# Patient Record
Sex: Female | Born: 1945 | Race: White | Hispanic: No | Marital: Married | Smoking: Former smoker
Health system: Southern US, Community
[De-identification: ages and names within clinical notes are randomized; demographics above are authoritative.]

## PROBLEM LIST (undated history)

## (undated) DIAGNOSIS — E039 Hypothyroidism, unspecified: Secondary | ICD-10-CM

## (undated) DIAGNOSIS — I10 Essential (primary) hypertension: Secondary | ICD-10-CM

## (undated) DIAGNOSIS — J302 Other seasonal allergic rhinitis: Secondary | ICD-10-CM

## (undated) HISTORY — PX: APPENDECTOMY: SHX54

## (undated) HISTORY — PX: OTHER SURGICAL HISTORY: SHX169

---

## 2019-06-29 ENCOUNTER — Other Ambulatory Visit: Payer: Self-pay | Admitting: Family Medicine

## 2019-06-29 DIAGNOSIS — Z1231 Encounter for screening mammogram for malignant neoplasm of breast: Secondary | ICD-10-CM

## 2019-08-12 ENCOUNTER — Ambulatory Visit
Admission: RE | Admit: 2019-08-12 | Discharge: 2019-08-12 | Disposition: A | Discharge status: Home / Self Care | Payer: Medicare Other | Source: Ambulatory Visit | Attending: Family Medicine | Admitting: Family Medicine

## 2019-08-12 ENCOUNTER — Other Ambulatory Visit: Payer: Self-pay

## 2019-08-12 DIAGNOSIS — Z1231 Encounter for screening mammogram for malignant neoplasm of breast: Secondary | ICD-10-CM

## 2020-09-14 ENCOUNTER — Other Ambulatory Visit: Payer: Self-pay | Admitting: Physician Assistant

## 2020-09-14 DIAGNOSIS — Z1231 Encounter for screening mammogram for malignant neoplasm of breast: Secondary | ICD-10-CM

## 2020-10-17 ENCOUNTER — Ambulatory Visit: Payer: Medicare Other

## 2020-11-30 ENCOUNTER — Ambulatory Visit: Payer: Medicare Other

## 2020-12-26 ENCOUNTER — Ambulatory Visit
Admission: RE | Admit: 2020-12-26 | Discharge: 2020-12-26 | Disposition: A | Discharge status: Home / Self Care | Payer: Medicare Other | Source: Ambulatory Visit | Attending: Physician Assistant | Admitting: Physician Assistant

## 2020-12-26 ENCOUNTER — Other Ambulatory Visit: Payer: Self-pay

## 2020-12-26 DIAGNOSIS — Z1231 Encounter for screening mammogram for malignant neoplasm of breast: Secondary | ICD-10-CM

## 2021-04-06 IMAGING — MG MM DIGITAL SCREENING BILAT W/ TOMO AND CAD
8 of 14 series · 8 of 40 positions shown · non-contrast
Comparison: Previous exam(s).

CLINICAL DATA: Screening.

EXAM:
DIGITAL SCREENING BILATERAL MAMMOGRAM WITH TOMO AND CAD

[L MLO synth-2D (1 of 2)]
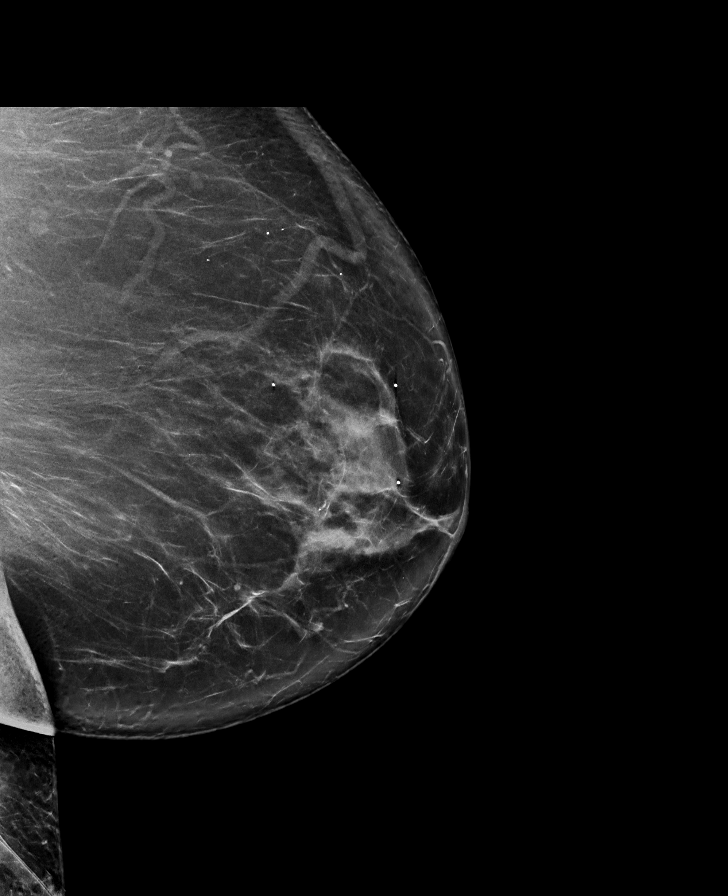

[L CC synth-2D]
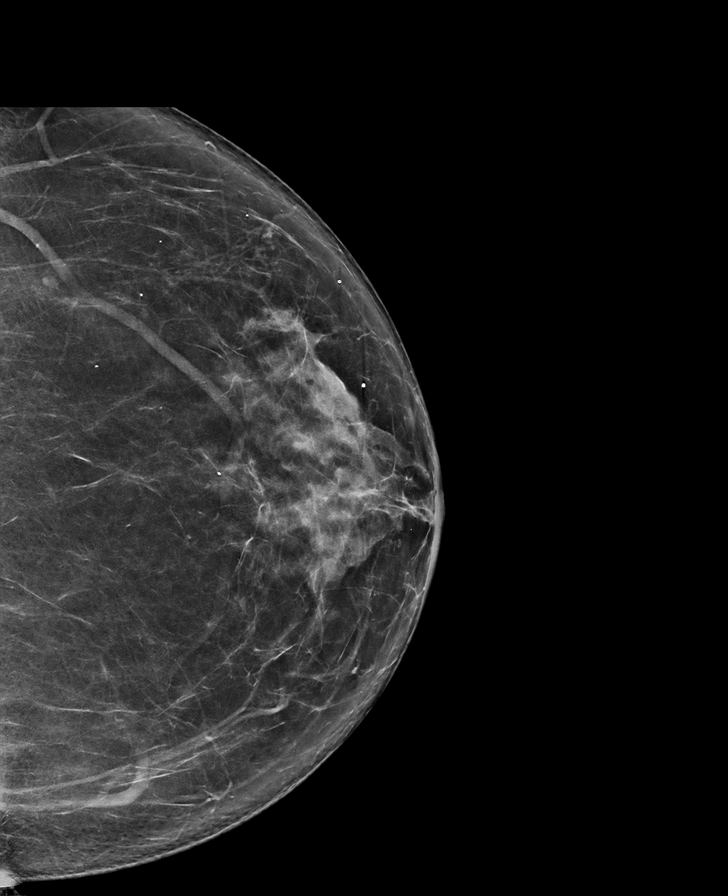

[R CC synth-2D (1 of 2)]
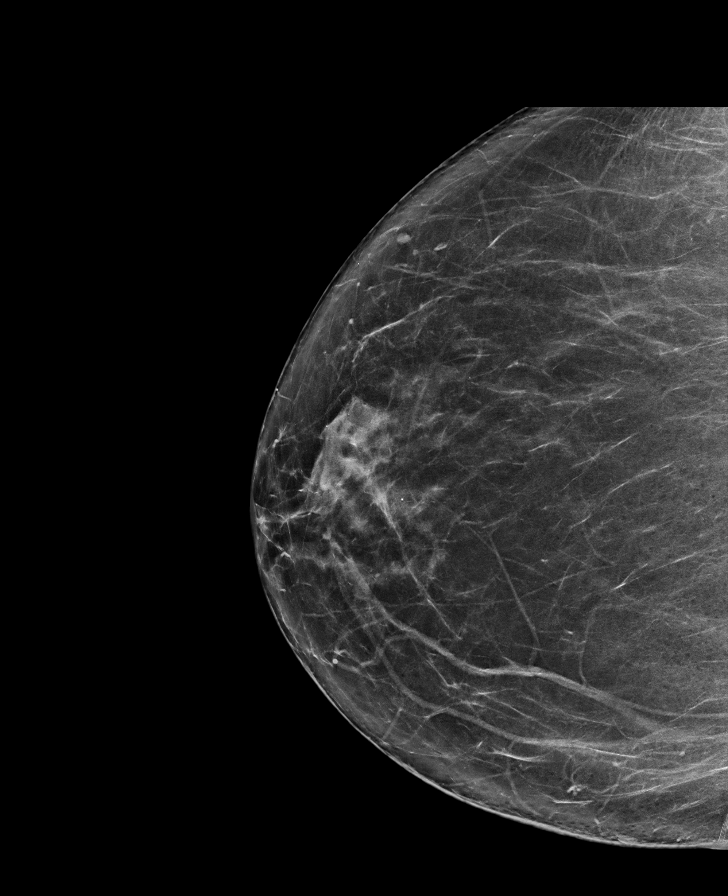

[R CC synth-2D (2 of 2)]
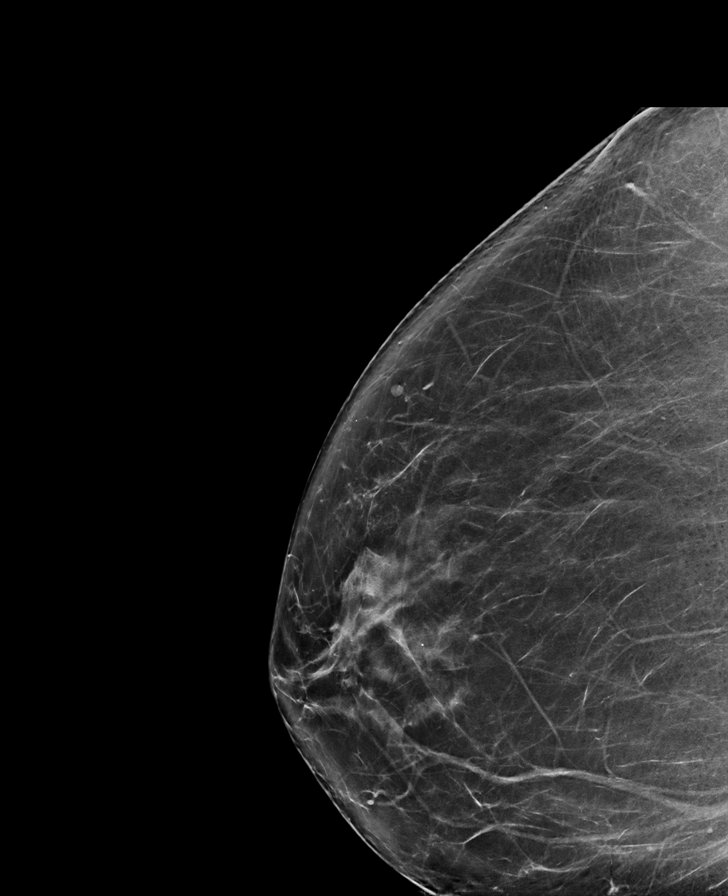

[R MLO synth-2D (1 of 2)]
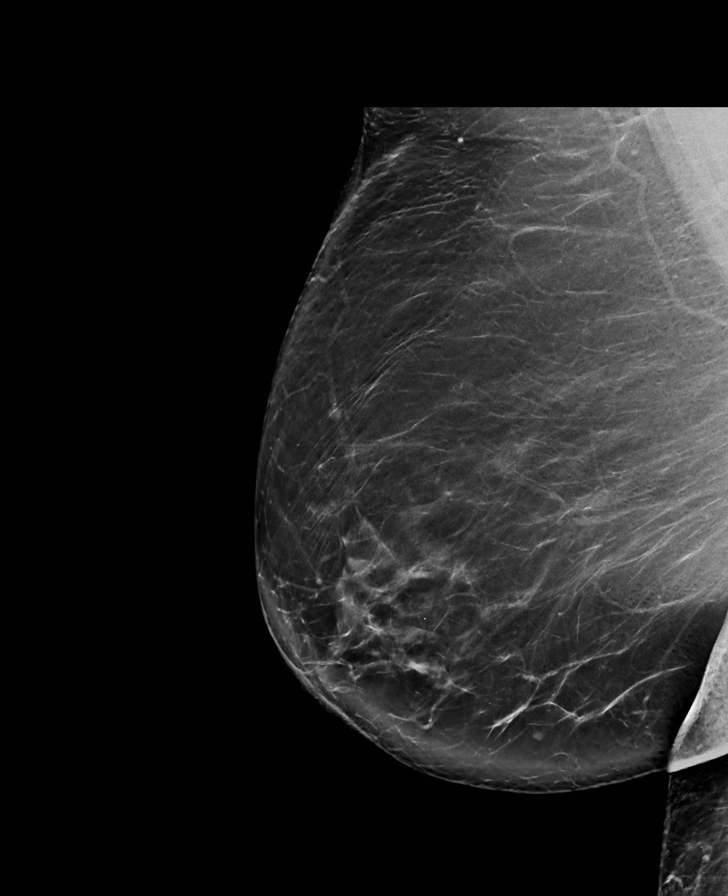

[R MLO synth-2D (2 of 2)]
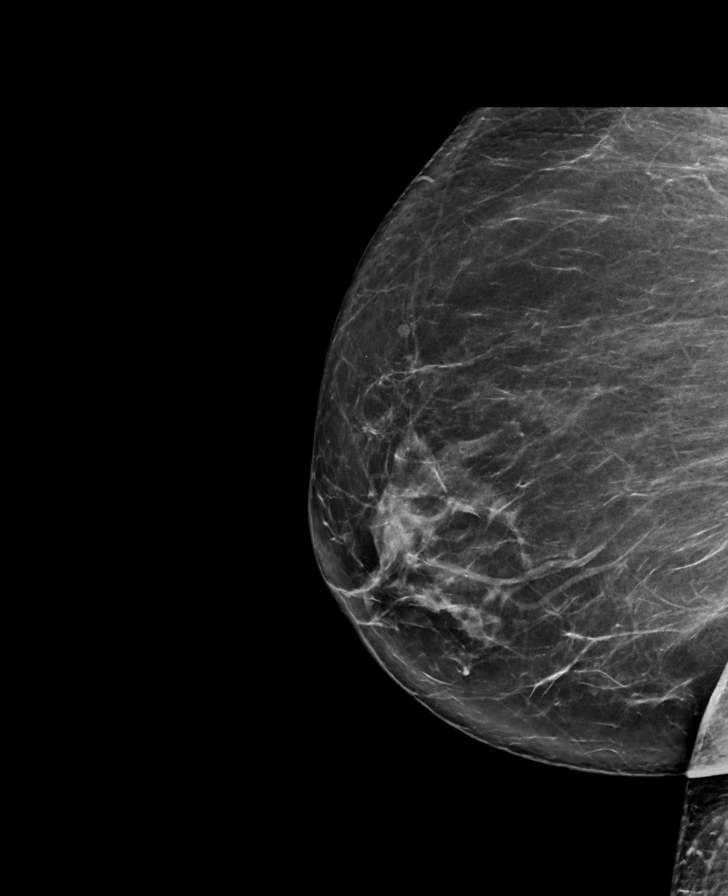

[L MLO synth-2D (2 of 2)]
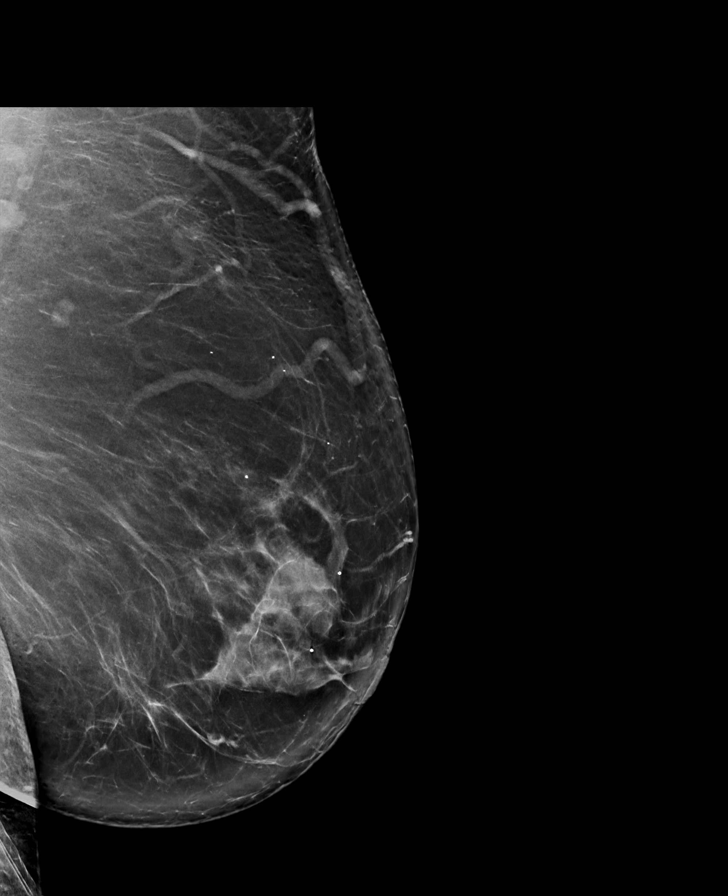

[R CC tomo · tomo slice 40/79.0]
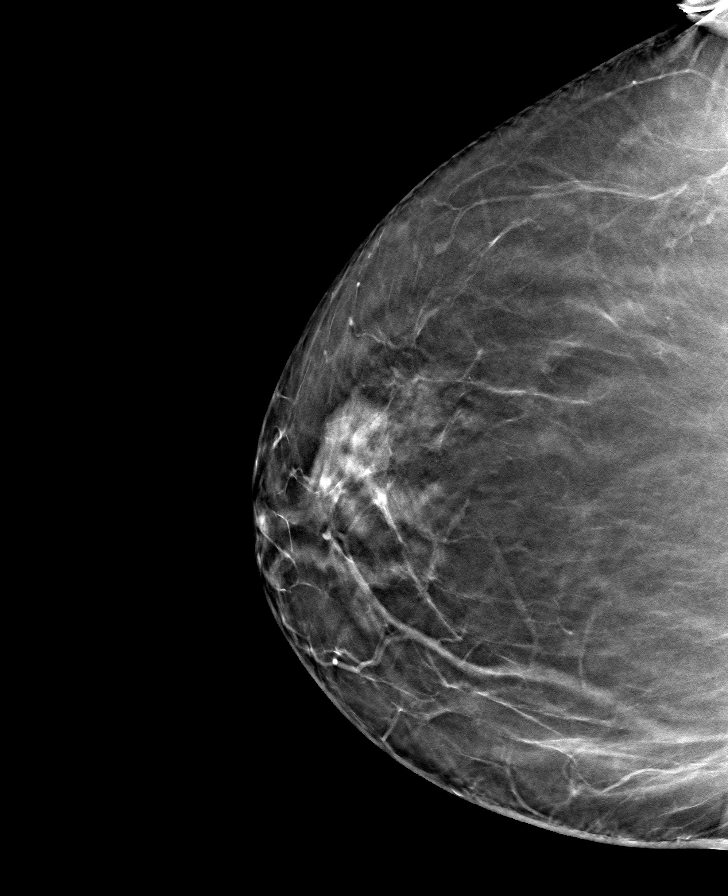

[8 of 40 positions shown; findings below may reference images not displayed]

ACR Breast Density Category b: There are scattered areas of
fibroglandular density.
FINDINGS: There are no findings suspicious for malignancy. The images were
evaluated with computer-aided detection.
IMPRESSION: No mammographic evidence of malignancy. A result letter of this
screening mammogram will be mailed directly to the patient.

RECOMMENDATION:
Screening mammogram in one year. (Code:ZP-7-VX7)

BI-RADS CATEGORY  1: Negative.

## 2022-05-11 ENCOUNTER — Other Ambulatory Visit: Payer: Self-pay

## 2022-05-11 ENCOUNTER — Encounter (HOSPITAL_COMMUNITY): Payer: Self-pay | Admitting: Emergency Medicine

## 2022-05-11 ENCOUNTER — Emergency Department (HOSPITAL_COMMUNITY)
Admission: EM | Admit: 2022-05-11 | Discharge: 2022-05-11 | Disposition: A | Discharge status: Home / Self Care | Payer: Medicare Other | Attending: Emergency Medicine | Admitting: Emergency Medicine

## 2022-05-11 DIAGNOSIS — Y92000 Kitchen of unspecified non-institutional (private) residence as  the place of occurrence of the external cause: Secondary | ICD-10-CM | POA: Diagnosis not present

## 2022-05-11 DIAGNOSIS — W260XXA Contact with knife, initial encounter: Secondary | ICD-10-CM | POA: Insufficient documentation

## 2022-05-11 DIAGNOSIS — S61214A Laceration without foreign body of right ring finger without damage to nail, initial encounter: Secondary | ICD-10-CM | POA: Insufficient documentation

## 2022-05-11 DIAGNOSIS — S60944A Unspecified superficial injury of right ring finger, initial encounter: Secondary | ICD-10-CM | POA: Diagnosis present

## 2022-05-11 HISTORY — DX: Other seasonal allergic rhinitis: J30.2

## 2022-05-11 HISTORY — DX: Hypothyroidism, unspecified: E03.9

## 2022-05-11 HISTORY — DX: Essential (primary) hypertension: I10

## 2022-05-11 MED ORDER — LIDOCAINE HCL (PF) 1 % IJ SOLN
5.0000 mL | Freq: Once | INTRAMUSCULAR | Status: DC
  Filled 2022-05-11: qty 5

## 2022-05-11 MED ORDER — POVIDONE-IODINE 10 % EX SOLN
CUTANEOUS | Status: AC
  Filled 2022-05-11: qty 29.6

## 2022-05-11 MED ORDER — TETANUS-DIPHTH-ACELL PERTUSSIS 5-2.5-18.5 LF-MCG/0.5 IM SUSY: 0.5000 mL | PREFILLED_SYRINGE | Freq: Once | INTRAMUSCULAR | Status: DC

## 2022-05-11 NOTE — ED Notes (Signed)
Went to discharge patient and give medicine.  Patient left without telling anyone.

## 2022-05-11 NOTE — ED Provider Notes (Signed)
[facility] EMERGENCY DEPARTMENT Provider Note   CSN: [id number] Arrival date & time: 05/11/22  1721     History  Chief Complaint  Patient presents with   Laceration    Maureen Nash is a 76 y.o. female.   Laceration      Maureen Nash is a 76 y.o. female who presents to the Emergency Department complaining of laceration of her right ring finger that occurred earlier today.  She states she was using a kitchen knife to cut sweet potatoes when the laceration occurred.  She has a flap to the distal tip of her finger.  Bleeding has been controlled with direct pressure.  She is unsure of her last tetanus.  She denies any numbness or swelling of her finger.  She does not take any blood thinners.  Home Medications Prior to Admission medications   Not on File      Allergies    Penicillins    Review of Systems   Review of Systems  Skin:  Positive for wound (Laceration right ring finger).  Neurological:  Negative for weakness and numbness.  Hematological:  Does not bruise/bleed easily.    Physical Exam Updated Vital Signs BP (!) 154/65 (BP Location: Right Wrist)   Pulse (!) 44   Temp 97.8 F (36.6 C) (Oral)   Resp 16   Ht 5\' 2"  (1.575 m)   Wt 99.8 kg   SpO2 100%   BMI 40.24 kg/m  Physical Exam Vitals and nursing note reviewed.  Constitutional:      General: She is not in acute distress.    Appearance: Normal appearance. She is not toxic-appearing.  Cardiovascular:     Rate and Rhythm: Normal rate and regular rhythm.     Pulses: Normal pulses.  Pulmonary:     Effort: Pulmonary effort is normal.  Musculoskeletal:     Right hand: Laceration present. No swelling or bony tenderness. Normal range of motion. Normal strength. Normal sensation. There is no disruption of two-point discrimination. Normal capillary refill. Normal pulse.     Comments: Flap type laceration 1.5 cm to the distal tip of the right ring finger.  Bleeding controlled.  No foreign body seen.   Skin:    General: Skin is warm.     Capillary Refill: Capillary refill takes less than 2 seconds.  Neurological:     General: No focal deficit present.     Mental Status: She is alert.     ED Results / Procedures / Treatments   Labs (all labs ordered are listed, but only abnormal results are displayed) Labs Reviewed - No data to display  EKG None  Radiology No results found.  Procedures Procedures     LACERATION REPAIR Performed by: Mycah Formica Authorized by: Kenli Waldo Consent: Verbal consent obtained. Risks and benefits: risks, benefits and alternatives were discussed Consent given by: patient Patient identity confirmed: provided demographic data Prepped and Draped in normal sterile fashion Wound explored  Laceration Location: Right ring finger  Laceration Length: 1.5 cm  No Foreign Bodies seen or palpated  Anesthesia: local infiltration  Local anesthetic: lidocaine 1% without epinephrine  Anesthetic total: 2 ml  Irrigation method: syringe Amount of cleaning: Nash  Skin closure: 4-0 Ethilon  Number of sutures: 4  Technique: Simple interrupted  Patient tolerance: Patient tolerated the procedure well with no immediate complications.  Medications Ordered in ED Medications  lidocaine (PF) (XYLOCAINE) 1 % injection 5 mL (has no administration in time range)  povidone-iodine (BETADINE) 10 %  external solution (has no administration in time range)    ED Course/ Medical Decision Making/ A&P                           Medical Decision Making Risk Prescription drug management.   Patient here for evaluation of laceration to the tip of her right ring finger.  Neurovascularly intact.  Bleeding controlled prior to wound closure with direct pressure.  She is not taking any blood thinners.  Wound examined through range of motion and entire depth of wound.  No bony injury or tendon injury seen.  No foreign bodies.  Wound irrigated and approximated  well with sutures.  Td updated.  Patient agreeable to wound care instructions and sutures out in 8 to 10 days.        Final Clinical Impression(s) / ED Diagnoses Final diagnoses:  Laceration of right ring finger without foreign body without damage to nail, initial encounter    Rx / DC Orders ED Discharge Orders     None         Rosey Bath 05/11/22 2102    Bethann Berkshire, MD 05/13/22 1331

## 2022-05-11 NOTE — Discharge Instructions (Signed)
Keep the wound clean with mild soap and water.  Keep it bandaged.  Sutures out in 8 to 10 days.  Return to the emergency department for any worsening symptoms or signs of infection.

## 2022-05-11 NOTE — ED Triage Notes (Signed)
Pt to the ED with complaints of a laceration to her right ring finger.  Bleeding controlled.

## 2022-11-04 ENCOUNTER — Other Ambulatory Visit: Payer: Self-pay | Admitting: Physician Assistant

## 2022-11-04 DIAGNOSIS — E2839 Other primary ovarian failure: Secondary | ICD-10-CM

## 2022-11-04 DIAGNOSIS — Z1231 Encounter for screening mammogram for malignant neoplasm of breast: Secondary | ICD-10-CM

## 2023-04-29 ENCOUNTER — Ambulatory Visit
Admission: RE | Admit: 2023-04-29 | Discharge: 2023-04-29 | Disposition: A | Discharge status: Home / Self Care | Payer: Medicare Other | Source: Ambulatory Visit | Attending: Physician Assistant | Admitting: Physician Assistant

## 2023-04-29 DIAGNOSIS — Z1231 Encounter for screening mammogram for malignant neoplasm of breast: Secondary | ICD-10-CM

## 2023-04-29 DIAGNOSIS — E2839 Other primary ovarian failure: Secondary | ICD-10-CM
